# Patient Record
Sex: Female | Born: 1980 | Race: White | Hispanic: No | Marital: Married | Smoking: Current every day smoker
Health system: Southern US, Community
[De-identification: ages and names within clinical notes are randomized; demographics above are authoritative.]

## PROBLEM LIST (undated history)

## (undated) DIAGNOSIS — F419 Anxiety disorder, unspecified: Secondary | ICD-10-CM

---

## 2005-05-07 ENCOUNTER — Emergency Department: Payer: Self-pay | Admitting: Emergency Medicine

## 2005-06-03 ENCOUNTER — Emergency Department: Payer: Self-pay | Admitting: Emergency Medicine

## 2005-07-08 ENCOUNTER — Emergency Department: Payer: Self-pay | Admitting: Emergency Medicine

## 2006-01-04 ENCOUNTER — Emergency Department: Payer: Self-pay | Admitting: Internal Medicine

## 2010-04-23 ENCOUNTER — Emergency Department: Payer: Self-pay | Admitting: Emergency Medicine

## 2010-11-05 ENCOUNTER — Inpatient Hospital Stay (INDEPENDENT_AMBULATORY_CARE_PROVIDER_SITE_OTHER)
Admission: RE | Admit: 2010-11-05 | Discharge: 2010-11-05 | Disposition: A | Discharge status: Home / Self Care | Payer: Self-pay | Source: Ambulatory Visit | Attending: Family Medicine | Admitting: Family Medicine

## 2010-11-05 DIAGNOSIS — N3090 Cystitis, unspecified without hematuria: Secondary | ICD-10-CM

## 2010-11-05 LAB — POCT URINALYSIS DIP (DEVICE)
Glucose, UA: NEGATIVE mg/dL
Nitrite: NEGATIVE
Specific Gravity, Urine: 1.02 (ref 1.005–1.030)

## 2010-11-05 LAB — POCT PREGNANCY, URINE: Preg Test, Ur: NEGATIVE

## 2011-05-31 ENCOUNTER — Emergency Department: Payer: Self-pay | Admitting: Emergency Medicine

## 2011-05-31 LAB — COMPREHENSIVE METABOLIC PANEL
Anion Gap: 12 (ref 7–16)
Bilirubin,Total: 0.6 mg/dL (ref 0.2–1.0)
Calcium, Total: 8.6 mg/dL (ref 8.5–10.1)
Co2: 21 mmol/L (ref 21–32)
EGFR (African American): >60
EGFR (Non-African Amer.): >60
Glucose: 114 mg/dL — ABNORMAL HIGH (ref 65–99)
Potassium: 3.6 mmol/L (ref 3.5–5.1)
Total Protein: 7.6 g/dL (ref 6.4–8.2)

## 2011-05-31 LAB — CBC
HCT: 43.5 % (ref 35.0–47.0)
MCH: 29.3 pg (ref 26.0–34.0)
MCHC: 33.2 g/dL (ref 32.0–36.0)
MCV: 88 fL (ref 80–100)
RDW: 12.9 % (ref 11.5–14.5)

## 2011-05-31 LAB — URINALYSIS, COMPLETE
Bilirubin,UR: NEGATIVE
Blood: NEGATIVE
Glucose,UR: NEGATIVE mg/dL (ref 0–75)
Nitrite: NEGATIVE
Ph: 5 (ref 4.5–8.0)
Protein: 30
Specific Gravity: 1.033 (ref 1.003–1.030)

## 2011-05-31 LAB — PREGNANCY, URINE: Pregnancy Test, Urine: NEGATIVE m[IU]/mL

## 2011-05-31 LAB — RAPID INFLUENZA A&B ANTIGENS

## 2011-09-10 ENCOUNTER — Emergency Department: Payer: Self-pay | Admitting: Emergency Medicine

## 2014-01-21 ENCOUNTER — Emergency Department: Payer: Self-pay | Admitting: Emergency Medicine

## 2014-01-21 LAB — URINALYSIS, COMPLETE
BILIRUBIN, UR: NEGATIVE
BLOOD: NEGATIVE
Bacteria: NONE SEEN
GLUCOSE, UR: NEGATIVE mg/dL (ref 0–75)
KETONE: NEGATIVE
Leukocyte Esterase: NEGATIVE
NITRITE: NEGATIVE
PROTEIN: NEGATIVE
Ph: 8 (ref 4.5–8.0)
RBC,UR: 1 /HPF (ref 0–5)
Specific Gravity: 1.013 (ref 1.003–1.030)
WBC UR: NONE SEEN /HPF (ref 0–5)

## 2014-01-21 LAB — COMPREHENSIVE METABOLIC PANEL
ALBUMIN: 3.2 g/dL — AB (ref 3.4–5.0)
ALT: 18 U/L
AST: 19 U/L (ref 15–37)
Alkaline Phosphatase: 61 U/L
Anion Gap: 5 — ABNORMAL LOW (ref 7–16)
BUN: 9 mg/dL (ref 7–18)
Bilirubin,Total: 0.3 mg/dL (ref 0.2–1.0)
CALCIUM: 8.2 mg/dL — AB (ref 8.5–10.1)
CO2: 30 mmol/L (ref 21–32)
Chloride: 108 mmol/L — ABNORMAL HIGH (ref 98–107)
Creatinine: 0.62 mg/dL (ref 0.60–1.30)
Glucose: 88 mg/dL (ref 65–99)
OSMOLALITY: 283 (ref 275–301)
POTASSIUM: 3.9 mmol/L (ref 3.5–5.1)
Sodium: 143 mmol/L (ref 136–145)
Total Protein: 6.5 g/dL (ref 6.4–8.2)

## 2014-01-21 LAB — LIPASE, BLOOD: LIPASE: 131 U/L (ref 73–393)

## 2014-01-21 LAB — CBC WITH DIFFERENTIAL/PLATELET
Basophil #: 0.1 10*3/uL (ref 0.0–0.1)
Basophil %: 0.6 %
EOS PCT: 1.1 %
Eosinophil #: 0.1 10*3/uL (ref 0.0–0.7)
HCT: 39.7 % (ref 35.0–47.0)
HGB: 12.3 g/dL (ref 12.0–16.0)
LYMPHS PCT: 24.8 %
Lymphocyte #: 3.2 10*3/uL (ref 1.0–3.6)
MCH: 28.7 pg (ref 26.0–34.0)
MCHC: 31.1 g/dL — ABNORMAL LOW (ref 32.0–36.0)
MCV: 92 fL (ref 80–100)
MONO ABS: 0.8 x10 3/mm (ref 0.2–0.9)
Monocyte %: 6.2 %
NEUTROS PCT: 67.3 %
Neutrophil #: 8.7 10*3/uL — ABNORMAL HIGH (ref 1.4–6.5)
Platelet: 250 10*3/uL (ref 150–440)
RBC: 4.3 10*6/uL (ref 3.80–5.20)
RDW: 13.9 % (ref 11.5–14.5)
WBC: 12.9 10*3/uL — ABNORMAL HIGH (ref 3.6–11.0)

## 2014-01-21 LAB — WET PREP, GENITAL

## 2014-01-21 LAB — GC/CHLAMYDIA PROBE AMP

## 2014-01-21 LAB — PREGNANCY, URINE: PREGNANCY TEST, URINE: NEGATIVE m[IU]/mL

## 2015-10-12 ENCOUNTER — Encounter: Payer: Self-pay | Admitting: Emergency Medicine

## 2015-10-12 ENCOUNTER — Emergency Department: Payer: Medicaid Other

## 2015-10-12 ENCOUNTER — Emergency Department
Admission: EM | Admit: 2015-10-12 | Discharge: 2015-10-12 | Disposition: A | Discharge status: Home / Self Care | Payer: Medicaid Other | Attending: Emergency Medicine | Admitting: Emergency Medicine

## 2015-10-12 DIAGNOSIS — R61 Generalized hyperhidrosis: Secondary | ICD-10-CM | POA: Diagnosis not present

## 2015-10-12 DIAGNOSIS — H9190 Unspecified hearing loss, unspecified ear: Secondary | ICD-10-CM | POA: Insufficient documentation

## 2015-10-12 DIAGNOSIS — H6693 Otitis media, unspecified, bilateral: Secondary | ICD-10-CM | POA: Diagnosis not present

## 2015-10-12 DIAGNOSIS — J069 Acute upper respiratory infection, unspecified: Secondary | ICD-10-CM | POA: Diagnosis not present

## 2015-10-12 DIAGNOSIS — H9193 Unspecified hearing loss, bilateral: Secondary | ICD-10-CM

## 2015-10-12 DIAGNOSIS — F17200 Nicotine dependence, unspecified, uncomplicated: Secondary | ICD-10-CM | POA: Diagnosis not present

## 2015-10-12 DIAGNOSIS — R112 Nausea with vomiting, unspecified: Secondary | ICD-10-CM | POA: Diagnosis not present

## 2015-10-12 DIAGNOSIS — R0602 Shortness of breath: Secondary | ICD-10-CM | POA: Diagnosis present

## 2015-10-12 HISTORY — DX: Anxiety disorder, unspecified: F41.9

## 2015-10-12 LAB — URINALYSIS COMPLETE WITH MICROSCOPIC (ARMC ONLY)
BACTERIA UA: NONE SEEN
Bilirubin Urine: NEGATIVE
Glucose, UA: NEGATIVE mg/dL
HGB URINE DIPSTICK: NEGATIVE
Ketones, ur: NEGATIVE mg/dL
LEUKOCYTES UA: NEGATIVE
Nitrite: NEGATIVE
PH: 7 (ref 5.0–8.0)
Protein, ur: NEGATIVE mg/dL
Specific Gravity, Urine: 1.017 (ref 1.005–1.030)

## 2015-10-12 LAB — CBC
HCT: 36.3 % (ref 35.0–47.0)
HEMOGLOBIN: 12.2 g/dL (ref 12.0–16.0)
MCH: 29.3 pg (ref 26.0–34.0)
MCHC: 33.6 g/dL (ref 32.0–36.0)
MCV: 87.1 fL (ref 80.0–100.0)
PLATELETS: 361 10*3/uL (ref 150–440)
RBC: 4.16 MIL/uL (ref 3.80–5.20)
RDW: 13.7 % (ref 11.5–14.5)
WBC: 14.7 10*3/uL — AB (ref 3.6–11.0)

## 2015-10-12 LAB — BASIC METABOLIC PANEL
ANION GAP: 7 (ref 5–15)
BUN: 10 mg/dL (ref 6–20)
CO2: 26 mmol/L (ref 22–32)
Calcium: 8.7 mg/dL — ABNORMAL LOW (ref 8.9–10.3)
Chloride: 105 mmol/L (ref 101–111)
Creatinine, Ser: 0.63 mg/dL (ref 0.44–1.00)
Glucose, Bld: 121 mg/dL — ABNORMAL HIGH (ref 65–99)
POTASSIUM: 4 mmol/L (ref 3.5–5.1)
SODIUM: 138 mmol/L (ref 135–145)

## 2015-10-12 LAB — POCT PREGNANCY, URINE: PREG TEST UR: NEGATIVE

## 2015-10-12 MED ORDER — AMOXICILLIN-POT CLAVULANATE 200-28.5 MG PO CHEW: 1.0000 | CHEWABLE_TABLET | Freq: Two times a day (BID) | ORAL | Status: AC

## 2015-10-12 NOTE — ED Provider Notes (Signed)
[facility] Emergency Department Provider Note  ____________________________________________  Time seen: Approximately 2:44 PM  I have reviewed the triage vital signs and the nursing notes.   HISTORY  Chief Complaint Shortness of Breath and Numbness    HPI Allison Mckee Abdalla is a 35 y.o. female with a history of anxiety resenting with decreased hearing, diaphoresis, and vomiting. The patient reports that last week she had an upper respiratory infection that was deemed viral by her primary care physician. Since then she has had "muffled" hearing as well as cough and congestion. She has not had any fever, chills, shortness of breath. Today she was at work when she noticed that she could no longer here with her boss was saying, developed diaphoresis, and had an episode of vomiting.Marland Kitchen. No chest pain, palpitations, lightheadedness or syncope. This episode was similar in character to previous panic attacks, but more severe.   Past Medical History  Diagnosis Date  . Anxiety     There are no active problems to display for this patient.   No past surgical history on file.  Current Outpatient Rx  Name  Route  Sig  Dispense  Refill  . amoxicillin-clavulanate (AUGMENTIN) 200-28.5 MG chewable tablet   Oral   Chew 1 tablet by mouth 2 (two) times daily.   20 tablet   0     Allergies Aleve and Vicodin  No family history on file.  Social History Social History  Substance Use Topics  . Smoking status: Current Every Day Smoker  . Smokeless tobacco: None  . Alcohol Use: No    Review of Systems Constitutional: No fever/chills.No lightheadedness or syncope. Positive diaphoresis. Eyes: No visual changes. EARS: Decreased hearing. ENT: Positive sore throat. Positive congestion and rhinorrhea. Cardiovascular: Denies chest pain. Denies palpitations. Respiratory: Denies shortness of breath.  Positive cough. Gastrointestinal: No abdominal pain.  Positive nausea,  positive vomiting.  No diarrhea.  No constipation. Genitourinary: Negative for dysuria. Musculoskeletal: Negative for back pain. Skin: Negative for rash. Neurological: Negative for headaches. No focal numbness, tingling or weakness.  Psych: pos anxiety 10-point ROS otherwise negative.  ____________________________________________   PHYSICAL EXAM:  VITAL SIGNS: ED Triage Vitals  Enc Vitals Group     BP 10/12/15 1319 141/89 mmHg     Pulse Rate 10/12/15 1319 79     Resp 10/12/15 1319 20     Temp 10/12/15 1319 97.8 F (36.6 C)     Temp src --      SpO2 10/12/15 1319 100 %     Weight --      Height --      Head Cir --      Peak Flow --      Pain Score 10/12/15 1315 0     Pain Loc --      Pain Edu? --      Excl. in GC? --     Constitutional: Alert and oriented. Well appearing and in no acute distress. Answers questions appropriately. Eyes: Conjunctivae are normal.  EOMI. No scleral icterus.No eye discharge. Head: Atraumatic. Nose: Positive congestion/rhinnorhea. Mouth/Throat: Mucous membranes are moist. No posterior pharyngeal erythema. Posterior palate is symmetric and uvula is midline. No tonsillar swelling but the right tonsil has some mild exudate. EARS: Bilateral ears have fluid behind the tympanic membranes, with some mild erythema around the circumference. The canals are clear bilaterally. Neck: No stridor.  Supple.  No JVD. No meningismus. Cardiovascular: Normal rate, regular rhythm. No murmurs, rubs or gallops.  Respiratory: Normal  respiratory effort.  No accessory muscle use or retractions. Lungs CTAB.  No wheezes, rales or ronchi. Gastrointestinal: Soft, nontender and nondistended.  No guarding or rebound.  No peritoneal signs. Musculoskeletal: No LE edema. No ttp in the calves or palpable cords.  Negative Homan's sign. Neurologic:  A&Ox3.  Speech is clear.  Face and smile are symmetric.  EOMI. PERRLA.  Moves all extremities well. Normal gait without ataxia. Skin:   Skin is warm, dry and intact. No rash noted. Psychiatric: Mood and affect are normal. Speech and behavior are normal.  Normal judgement.  ____________________________________________   LABS (all labs ordered are listed, but only abnormal results are displayed)  Labs Reviewed  URINALYSIS COMPLETEWITH MICROSCOPIC ([facility] ONLY) - Abnormal; Notable for the following:    Color, Urine YELLOW (*)    APPearance CLEAR (*)    Squamous Epithelial / LPF 0-5 (*)    All other components within normal limits  CBC - Abnormal; Notable for the following:    WBC 14.7 (*)    All other components within normal limits  BASIC METABOLIC PANEL - Abnormal; Notable for the following:    Glucose, Bld 121 (*)    Calcium 8.7 (*)    All other components within normal limits  POC URINE PREG, ED  POCT PREGNANCY, URINE   ____________________________________________  EKG  ED ECG REPORT I, Rockne Menghini, the attending physician, personally viewed and interpreted this ECG.   Date: 10/12/2015  EKG Time: 1324  Rate: 59  Rhythm: sinus bradycardia  Axis: Normal  Intervals:none  ST&T Change: Nonspecific T-wave inversion in V1. No ST elevation.  ____________________________________________  RADIOLOGY  Dg Chest 2 View  10/12/2015  CLINICAL DATA:  35 year old female with a history of arm numbness and shortness of breath EXAM: CHEST  2 VIEW COMPARISON:  None. FINDINGS: The heart size and mediastinal contours are within normal limits. Both lungs are clear. The visualized skeletal structures are unremarkable. IMPRESSION: No active cardiopulmonary disease. Signed, Yvone Neu. Loreta Ave, DO [facility] Radiology Electronically Signed   By: Gilmer Mor D.O.   On: 10/12/2015 13:41    ____________________________________________   PROCEDURES  Procedure(s) performed: None  Critical Care performed: No ____________________________________________   INITIAL  IMPRESSION / ASSESSMENT AND PLAN / ED COURSE  Pertinent labs & imaging results that were available during my care of the patient were reviewed by me and considered in my medical decision making (see chart for details).  35 y.o. female with a history of anxiety presenting with muffled hearing and congestion with tonsillar exudate on exam and fluid behind the bilateral tympanic membranes, as well as an episode of diaphoresis and vomiting earlier today., The patient is well-appearing and has stable vital signs. It is likely that she has a viral URI, but given that she has fluid and some erythema in the tympanic membranes, I'll treat her with antibiotics for otitis media. The episode that occurred where she lost her hearing completely is atypical, and may have been a panic attack. I do not see any evidence of other acute cardiac or infectious causes for this episode. At this time the patient is back to baseline. We'll get basic labs, and reevaluate the patient. Anticipate discharge with close PMD follow-up.  ----------------------------------------- 3:37 PM on 10/12/2015 -----------------------------------------  The patient's workup in the emergency department is reassuring. Her EKG does not show any signs of ischemic changes or arrhythmia. Her chest x-ray does not show any acute cardiopulmonary process. Her labs are within  normal limits. No plan to discharge the patient home with treatment for otitis media and have her follow-up with her primary care physician for further evaluation and treatment. ____________________________________________  FINAL CLINICAL IMPRESSION(S) / ED DIAGNOSES  Final diagnoses:  Bilateral acute otitis media, recurrence not specified, unspecified otitis media type  URI (upper respiratory infection)  Decreased hearing, bilateral  Non-intractable vomiting with nausea, vomiting of unspecified type  Diaphoresis      NEW MEDICATIONS STARTED DURING THIS VISIT:  New  Prescriptions   AMOXICILLIN-CLAVULANATE (AUGMENTIN) 200-28.5 MG CHEWABLE TABLET    Chew 1 tablet by mouth 2 (two) times daily.     Rockne Menghini, MD 10/12/15 1537

## 2015-10-12 NOTE — ED Notes (Addendum)
Pt to ed with c/o sob and difficulty hearing that started about 30 min pta.  Pt states she was at work and began to get sob and weak and then had difficulty hearing.  Pt then states she started having numbness in bilat arms.  Pt reports hx of anxiety and states recently her md has been decreasing her anxiety meds.  Currently states she is on 2 tabs of 0.5mg  of ativan everyday.

## 2015-10-12 NOTE — ED Notes (Addendum)
Pt with episode of hearing loss w/ ringing, weakness, diaphoresis.  Pt reports having virus with diarrhea last week, but states she has been feeling better x1 week.   Pt also notes her medication for anxiety and depression have been recently adjusted.

## 2015-10-12 NOTE — ED Notes (Signed)
Water and graham crackers given for PO challenge.

## 2015-10-12 NOTE — ED Notes (Signed)
Pt observed going out the door in wheelchair with family.  Noted to be smoking outside of the ER.

## 2015-10-12 NOTE — Discharge Instructions (Signed)
Please make a follow up appointment with a primary care physician.  Please take the entire course of antibiotics, even if you are feeling better.  Return to the emergency department for severe pain, lightheadedness or fainting, inability to keep down fluids, or for any other symptoms concerning to you.

## 2018-02-27 IMAGING — CR DG CHEST 2V
2 series · 2 of 2 positions shown · non-contrast
Comparison: None.

CLINICAL DATA: 35-year-old female with a history of arm numbness
and shortness of breath

EXAM:
CHEST  2 VIEW

[chest pa]
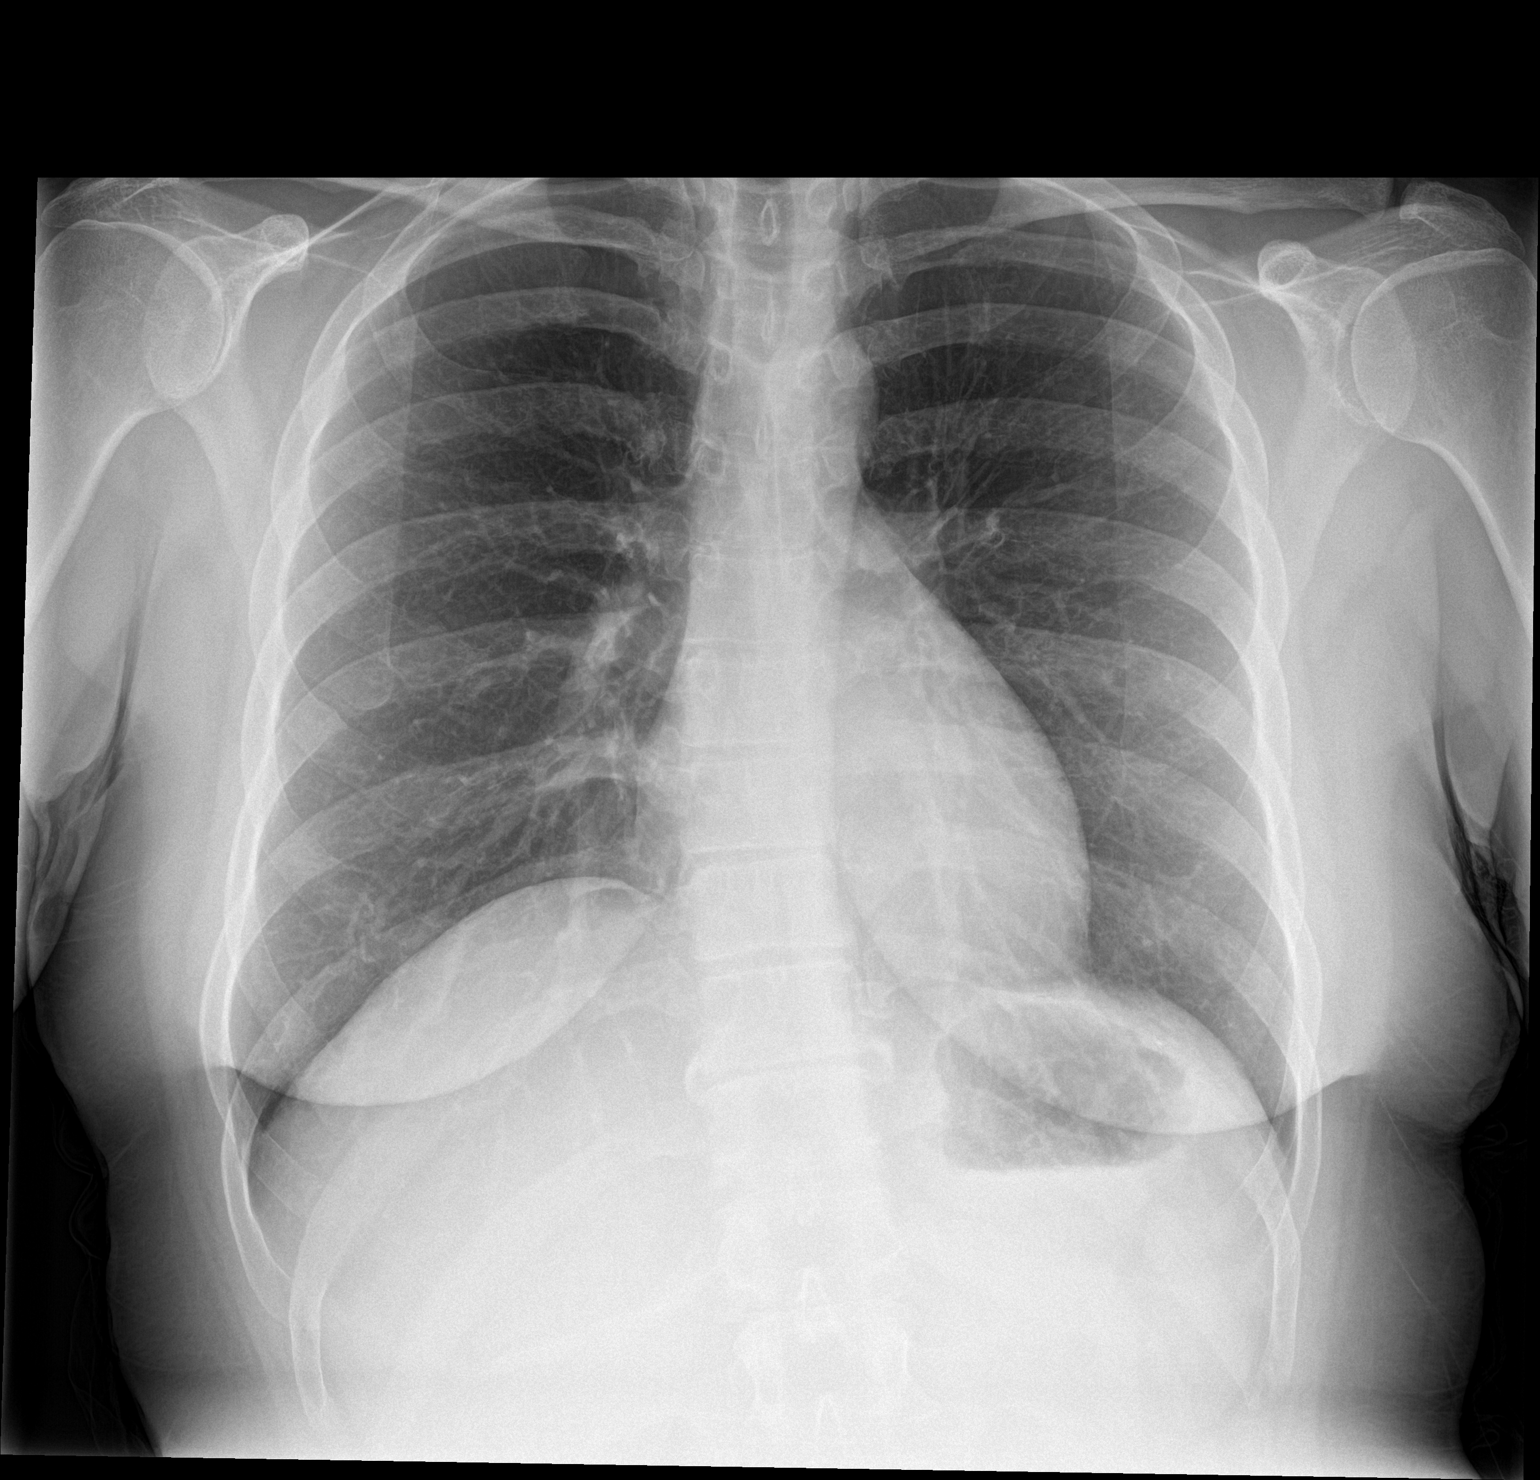

[chest lat]
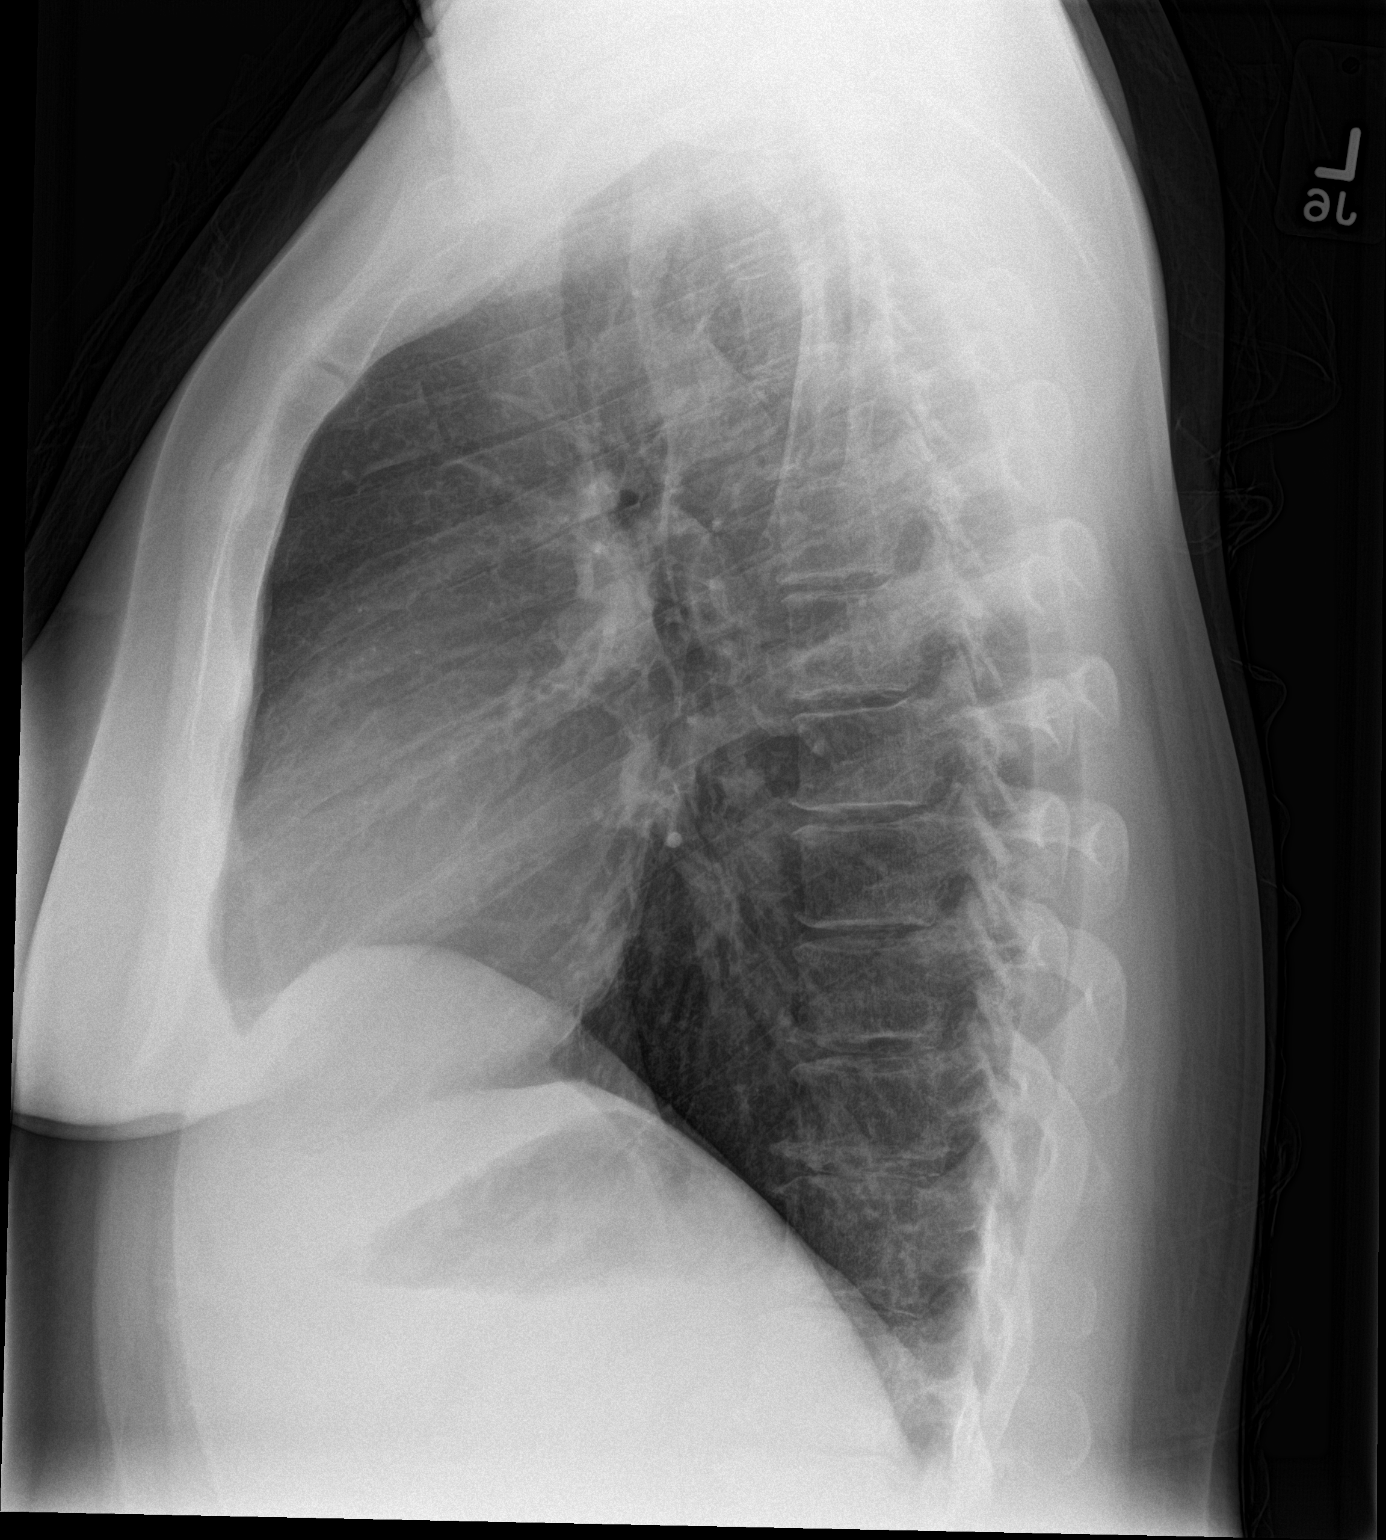

[2 of 2 positions shown; findings below may reference images not displayed]

FINDINGS: The heart size and mediastinal contours are within normal limits.
Both lungs are clear. The visualized skeletal structures are
unremarkable.
IMPRESSION: No active cardiopulmonary disease.

## 2018-03-10 ENCOUNTER — Other Ambulatory Visit: Payer: Self-pay

## 2018-03-10 ENCOUNTER — Emergency Department
Admission: EM | Admit: 2018-03-10 | Discharge: 2018-03-10 | Disposition: A | Discharge status: Home / Self Care | Payer: Self-pay | Attending: Emergency Medicine | Admitting: Emergency Medicine

## 2018-03-10 DIAGNOSIS — F410 Panic disorder [episodic paroxysmal anxiety] without agoraphobia: Secondary | ICD-10-CM | POA: Insufficient documentation

## 2018-03-10 DIAGNOSIS — F17200 Nicotine dependence, unspecified, uncomplicated: Secondary | ICD-10-CM | POA: Insufficient documentation

## 2018-03-10 NOTE — ED Triage Notes (Signed)
Pt arrived via ems from home from a panic attack. Pt ran out of her xanax last week. Pt seen having crying outbursts at random times. Pt A&Ox4, NAD.

## 2018-03-10 NOTE — ED Provider Notes (Signed)
[facility] Emergency Department Provider Note  ____________________________________________  Time seen: Approximately 9:48 PM  I have reviewed the triage vital signs and the nursing notes.   HISTORY  Chief Complaint Panic Attack    HPI Arlis CORI JUSTUS is a 37 y.o. female with a history of anxiety comes the ED after sudden onset of feeling like her whole body stiffened up today and she could not move.  She was very upset.  Onset was after being upset because her new puppy tore up the couch and she and her husband were arguing about financial concerns.   No loss of consciousness.  No chest pain shortness of breath fever vision change headache weakness or paresthesias.  On arrival to the ED she is feeling better.  She complains that her toes still feel curled up and stiff.  Denies any focal pain.  She notes that she has been on Ativan 2 mg tablets for many years.  She recently had seen a psychiatrist a few months ago and "begged them" to increase her dose but they did not.  They started her on a mood stabilizer for bipolar disorder.  She admits that over the last month she is been taking extra Ativan here in there because she has been feeling more stressed, and she ran out 5 days ago.  She does not see her doctor for another 3 days.  Denies SI HI or hallucinations.  No paranoia or delusions.     Past Medical History:  Diagnosis Date  . Anxiety      There are no active problems to display for this patient.    History reviewed. No pertinent surgical history.   Prior to Admission medications   Not on File     Allergies Aleve [naproxen sodium] and Vicodin [hydrocodone-acetaminophen]   History reviewed. No pertinent family history.  Social History Social History   Tobacco Use  . Smoking status: Current Every Day Smoker  Substance Use Topics  . Alcohol use: No  . Drug use: No    Review of Systems  Constitutional:   No fever or chills.  ENT:    No sore throat. No rhinorrhea. Cardiovascular:   No chest pain or syncope. Respiratory:   No dyspnea or cough. Gastrointestinal:   Negative for abdominal pain, vomiting and diarrhea.  Musculoskeletal:   Negative for focal pain or swelling All other systems reviewed and are negative except as documented above in ROS and HPI.  ____________________________________________   PHYSICAL EXAM:  VITAL SIGNS: ED Triage Vitals  Enc Vitals Group     BP 03/10/18 1830 (!) 150/76     Pulse Rate 03/10/18 1826 85     Resp 03/10/18 1826 (!) 22     Temp 03/10/18 1830 98.2 F (36.8 C)     Temp Source 03/10/18 1830 Oral     SpO2 03/10/18 1830 100 %     Weight 03/10/18 1827 170 lb (77.1 kg)     Height 03/10/18 1827 5\' 1"  (1.549 m)     Head Circumference --      Peak Flow --      Pain Score 03/10/18 1827 0     Pain Loc --      Pain Edu? --      Excl. in GC? --     Vital signs reviewed, nursing assessments reviewed.   Constitutional:   Alert and oriented. Non-toxic appearance. Eyes:   Conjunctivae are normal. EOMI.  ENT      Head:  Normocephalic and atraumatic.         Neck:   No meningismus. Full ROM.  Cardiovascular:   RRR. Symmetric bilateral DP pulses.  Cap refill less than 2 seconds. Respiratory:   Normal respiratory effort without tachypnea Musculoskeletal:   Normal range of motion in all extremities. No joint effusions.  No lower extremity tenderness.  No edema.  Toes are curled up, but can be straightened without tetany. Neurologic:   Normal speech and language.  Motor grossly intact. No acute focal neurologic deficits are appreciated.  Skin:    Skin is warm, dry and intact. No rash noted.  No petechiae, purpura, or bullae.  ____________________________________________    LABS (pertinent positives/negatives) (all labs ordered are listed, but only abnormal results are displayed) Labs Reviewed - No data to  display ____________________________________________   EKG    ____________________________________________    RADIOLOGY  No results found.  ____________________________________________   PROCEDURES Procedures  ____________________________________________    CLINICAL IMPRESSION / ASSESSMENT AND PLAN / ED COURSE  Pertinent labs & imaging results that were available during my care of the patient were reviewed by me and considered in my medical decision making (see chart for details).      Clinical Course as of Mar 11 2147  Wynelle LinkSun Mar 10, 2018  2008 Patient presented with panic attack characterized by feeling out of control and having her whole body tighten up on her, feeling immobilized.  No loss of consciousness.  Not consistent with stroke or seizure.  No significant trauma.  She is now feeling better and calm.  She attributes this to running out of her Ativan 5 days ago due to taking a little bit extra every now and then.  Review of the controlled substance reporting system shows that she actually has a refill available to her.  I will ask her to call her pharmacy to double check.  She also has an appointment with her doctor in 3 days already.   [PS]  2009 She has a supportive family at bedside.  No evidence of any danger to herself or others, no psychosis.  Do not see any reason to suspect an acute cardiac, pulmonary, neurologic, or vascular event.  She stable for discharge home.  Vital signs are normal.   [PS]    Clinical Course User Index [PS] Sharman CheekStafford, Iolani Twilley, MD     ____________________________________________   FINAL CLINICAL IMPRESSION(S) / ED DIAGNOSES    Final diagnoses:  Panic attack     ED Discharge Orders    None      Portions of this note were generated with dragon dictation software. Dictation errors may occur despite best attempts at proofreading.    Sharman CheekStafford, Camilla Skeen, MD 03/10/18 2152

## 2018-03-10 NOTE — Discharge Instructions (Addendum)
Continue following up with your doctor regarding your symptoms.  Check with your pharmacy about a refill of your Ativan.  According to our records, you have 1 refill available already.

## 2018-03-10 NOTE — ED Notes (Signed)
Pt expresses want to be discharged. EDP made aware.

## 2018-09-08 ENCOUNTER — Emergency Department
Admission: EM | Admit: 2018-09-08 | Discharge: 2018-09-08 | Disposition: A | Discharge status: Home / Self Care | Payer: Medicaid Other | Attending: Emergency Medicine | Admitting: Emergency Medicine

## 2018-09-08 ENCOUNTER — Other Ambulatory Visit: Payer: Self-pay

## 2018-09-08 ENCOUNTER — Encounter: Payer: Self-pay | Admitting: Emergency Medicine

## 2018-09-08 DIAGNOSIS — F17210 Nicotine dependence, cigarettes, uncomplicated: Secondary | ICD-10-CM | POA: Diagnosis not present

## 2018-09-08 DIAGNOSIS — R238 Other skin changes: Secondary | ICD-10-CM | POA: Diagnosis not present

## 2018-09-08 DIAGNOSIS — R21 Rash and other nonspecific skin eruption: Secondary | ICD-10-CM | POA: Diagnosis present

## 2018-09-08 MED ORDER — ACYCLOVIR 200 MG PO CAPS: 200.0000 mg | ORAL_CAPSULE | Freq: Every day | ORAL | 0 refills | Status: AC

## 2018-09-08 MED ORDER — CEPHALEXIN 500 MG PO CAPS: 1000.0000 mg | ORAL_CAPSULE | Freq: Two times a day (BID) | ORAL | 0 refills | Status: AC

## 2018-09-08 NOTE — ED Provider Notes (Signed)
[facility] Emergency Department Provider Note  ____________________________________________  Time seen: Approximately 10:51 PM  I have reviewed the triage vital signs and the nursing notes.   HISTORY  Chief Complaint Rash     HPI Allison Mckee is a 38 y.o. female who presents to the emergency department complaining of 7-10 days of increasing vesicular rash.   Patient reports that she first noted lesions to the right lateral thigh.  Since then she has noticed several other areas including the opposite extremity, 1 to the upper arm.  Patient reports that they are pruritic in nature until she touches them in which case they have a sharp pins and needle sensation.  Patient denies any enlargement of the vesicles once they formed.  She denies any fevers or chills, intraoral lesions, cough, shortness of breath, abdominal pain, nausea or vomiting.  No dysuria, polyuria, hematuria.  She denies any vaginal bleeding or discharge.  No external genital lesions.  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  She reports that this occurred after the formation of the first vesicle.  Patient also reports that she is on lamotrigine, originally had been tapered off and that has been starting same.  Patient reports that she had been on lamotrigine for a while with no difficulties.  Rash is not apparently associated with stopping or starting this medication.        Past Medical History:  Diagnosis Date  . Anxiety     There are no active problems to display for this patient.   History reviewed. No pertinent surgical history.  Prior to Admission medications   Medication Sig Start Date End Date Taking? Authorizing Provider  acyclovir (ZOVIRAX) 200 MG capsule Take 1 capsule (200 mg total) by mouth 5 (five) times daily for 7 days. 09/08/18 09/15/18  Julien Berryman, Delorise RoyalsJonathan D, PA-C  cephALEXin (KEFLEX) 500 MG capsule Take 2 capsules (1,000 mg total) by mouth 2  (two) times daily. 09/08/18   Shebra Muldrow, Delorise RoyalsJonathan D, PA-C    Allergies Aleve [naproxen sodium] and Vicodin [hydrocodone-acetaminophen]  No family history on file.  Social History Social History   Tobacco Use  . Smoking status: Current Every Day Smoker  . Smokeless tobacco: Never Used  Substance Use Topics  . Alcohol use: No  . Drug use: No     Review of Systems  Constitutional: No fever/chills Eyes: No visual changes. No discharge ENT: No upper respiratory complaints. Cardiovascular: no chest pain. Respiratory: no cough. No SOB. Gastrointestinal: No abdominal pain.  No nausea, no vomiting.  No diarrhea.  No constipation. Musculoskeletal: Negative for musculoskeletal pain. Skin: Positive for scattered vesicular rash to bilateral legs, R arm Neurological: Negative for headaches, focal weakness or numbness. 10-point ROS otherwise negative.  ____________________________________________   PHYSICAL EXAM:  VITAL SIGNS: ED Triage Vitals  Enc Vitals Group     BP 09/08/18 2141 120/88     Pulse Rate 09/08/18 2141 80     Resp 09/08/18 2141 18     Temp 09/08/18 2141 98.3 F (36.8 C)     Temp Source 09/08/18 2141 Oral     SpO2 09/08/18 2141 98 %     Weight 09/08/18 2140 170 lb (77.1 kg)     Height 09/08/18 2140 5\' 1"  (1.549 m)     Head Circumference --      Peak Flow --      Pain Score 09/08/18 2140 0     Pain Loc --  Pain Edu? --      Excl. in [place]? --      Constitutional: Alert and oriented. Well appearing and in no acute distress. Eyes: Conjunctivae are normal. PERRL. EOMI. Head: Atraumatic. ENT:      Ears:       Nose: No congestion/rhinnorhea.      Mouth/Throat: Mucous membranes are moist. No intraoral lesions Neck: No stridor.  Hematological/Lymphatic/Immunilogical: No cervical lymphadenopathy. Cardiovascular: Normal rate, regular rhythm. Normal S1 and S2.  Good peripheral circulation. Respiratory: Normal respiratory effort without tachypnea or retractions.  Lungs CTAB. Good air entry to the bases with no decreased or absent breath sounds. Musculoskeletal: Full range of motion to all extremities. No gross deformities appreciated. Neurologic:  Normal speech and language. No gross focal neurologic deficits are appreciated.  Skin:  Skin is warm, dry and intact.  Visualization of skin reveals multiple areas of small, clustered vesicles.  The sites are primarily right lateral thigh, left lower extremity below the knee, right upper arm.  There is no sloughing or loosening of the skin.  Patient has no surrounding erythema or edema.  Areas are minimally tender to palpation. Psychiatric: Mood and affect are normal. Speech and behavior are normal. Patient exhibits appropriate insight and judgement.   ____________________________________________   LABS (all labs ordered are listed, but only abnormal results are displayed)  Labs Reviewed - No data to display ____________________________________________  EKG   ____________________________________________  RADIOLOGY   No results found.  ____________________________________________    PROCEDURES  Procedure(s) performed:    Procedures    Medications - No data to display   ____________________________________________   INITIAL IMPRESSION / ASSESSMENT AND PLAN / ED COURSE  Pertinent labs & imaging results that were available during my care of the patient were reviewed by me and considered in my medical decision making (see chart for details).  Review of the [place] CSRS was performed in accordance of the [place] prior to dispensing any controlled drugs.           Patient's diagnosis is consistent with vesicular rash.  Patient presented to the emergency department for slowly spreading vesicular rash over the past 7 to 10 days.  Patient had one lesion to the lateral thigh on the right side and now has experienced lesions to the right upper arm as well as the left lower extremity.  Differential  included bullous pemphigoid or bullous impetigo, herpes zoster, herpetic eczema, Stevens-Johnson syndrome.  Patient is on lamotrigine.  However as this rash has slowly spread over 7 to 10 days with no worsening of vesicles with they have formed, I have a low suspicion for Stevens-Johnson's.  There is no intraoral lesions or airway involvement.  No sloughing of skin.  Patient is on lamotrigine and so I have advised her to follow-up with her psychiatrist in the morning to discuss this rash, again I have a very low suspicion at this time of Stevens-Johnson's.  Rash has the appearance of herpetic eczema versus bullous pemphigoid.  Patient does have a history of cold sores.  No history of genital herpes.  Patient has under gone stress, apparently appears to be going through some hormone changes as well.  I suspect that this is either bullous pemphigoid/impetigo versus herpetic eczema.  As such patient will be started on both antiviral as well as antibiotics.  I have discussed at length my differential with the patient.  If patient has any sudden changes or worsening she is to return immediately to the emergency department as  differential did include Stevens-Johnson's.  Otherwise follow with primary care and psychiatry..Patient is given ED precautions to return to the ED for any worsening or new symptoms.     ____________________________________________  FINAL CLINICAL IMPRESSION(S) / ED DIAGNOSES  Final diagnoses:  Vesicular rash      NEW MEDICATIONS STARTED DURING THIS VISIT:  ED Discharge Orders         Ordered    acyclovir (ZOVIRAX) 200 MG capsule  5 times daily     09/08/18 2322    cephALEXin (KEFLEX) 500 MG capsule  2 times daily     09/08/18 2322              This chart was dictated using voice recognition software/Dragon. Despite best efforts to proofread, errors can occur which can change the meaning. Any change was purely unintentional.    Racheal PatchesCuthriell, Melita Villalona D, PA-C 09/09/18  0039    Arnaldo NatalMalinda, Paul F, MD 09/09/18 [id number]

## 2018-09-08 NOTE — ED Triage Notes (Signed)
Pt arrives POV to triage with c/o rash on body. Rash in in several spots on pt's body. Pt is in NAD.

## 2019-01-17 ENCOUNTER — Ambulatory Visit
Admission: RE | Admit: 2019-01-17 | Discharge: 2019-01-17 | Disposition: A | Discharge status: Home / Self Care | Payer: Disability Insurance | Attending: Thoracic Diseases | Admitting: Thoracic Diseases

## 2019-01-17 ENCOUNTER — Ambulatory Visit
Admission: RE | Admit: 2019-01-17 | Discharge: 2019-01-17 | Disposition: A | Discharge status: Home / Self Care | Payer: Disability Insurance | Source: Ambulatory Visit | Attending: Thoracic Diseases | Admitting: Thoracic Diseases

## 2019-01-17 ENCOUNTER — Other Ambulatory Visit: Payer: Self-pay | Admitting: Thoracic Diseases

## 2019-01-17 DIAGNOSIS — M549 Dorsalgia, unspecified: Secondary | ICD-10-CM | POA: Insufficient documentation

## 2021-06-04 IMAGING — CR DG LUMBAR SPINE 2-3V
3 series · 3 of 3 positions shown · non-contrast
Comparison: October 16, 2011

CLINICAL DATA: Chronic lumbago

EXAM:
LUMBAR SPINE - 2-3 VIEW

[l-spine ap]
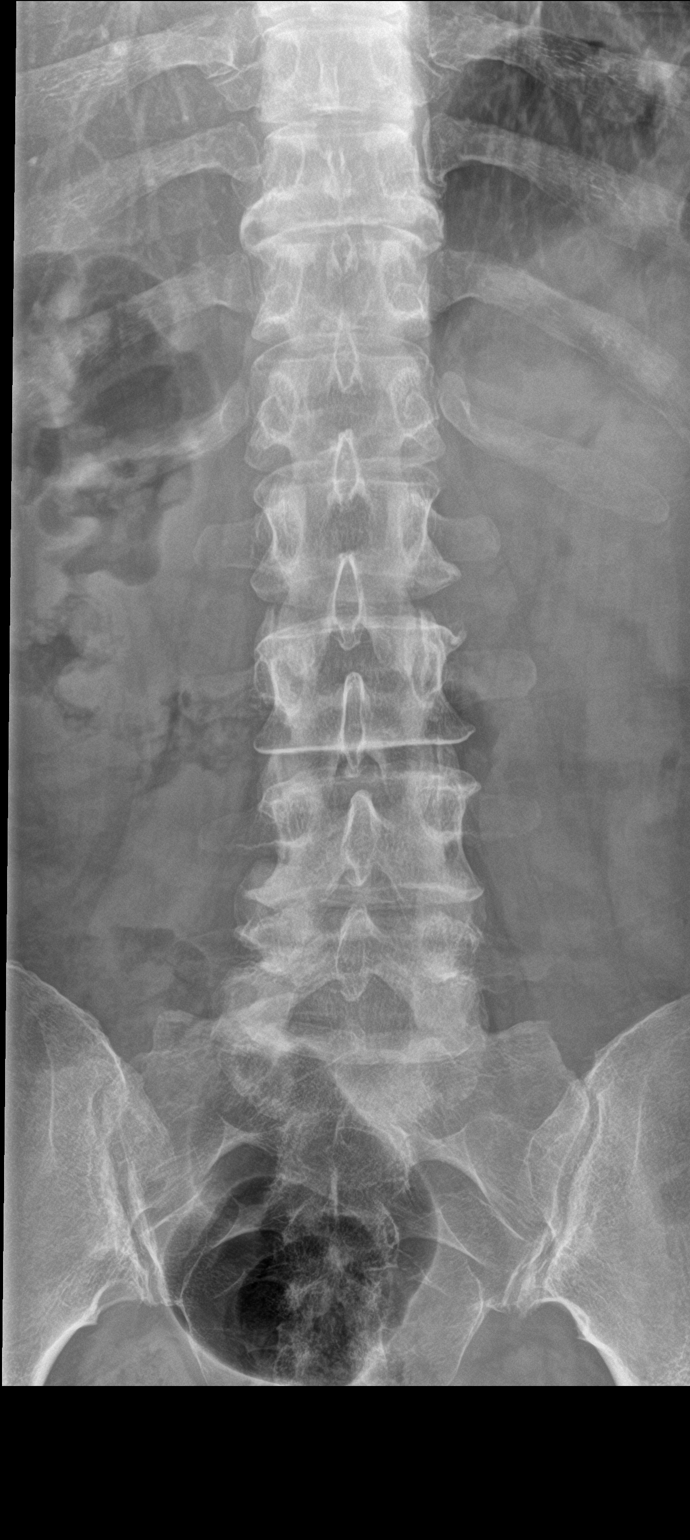

[l-spine lat]
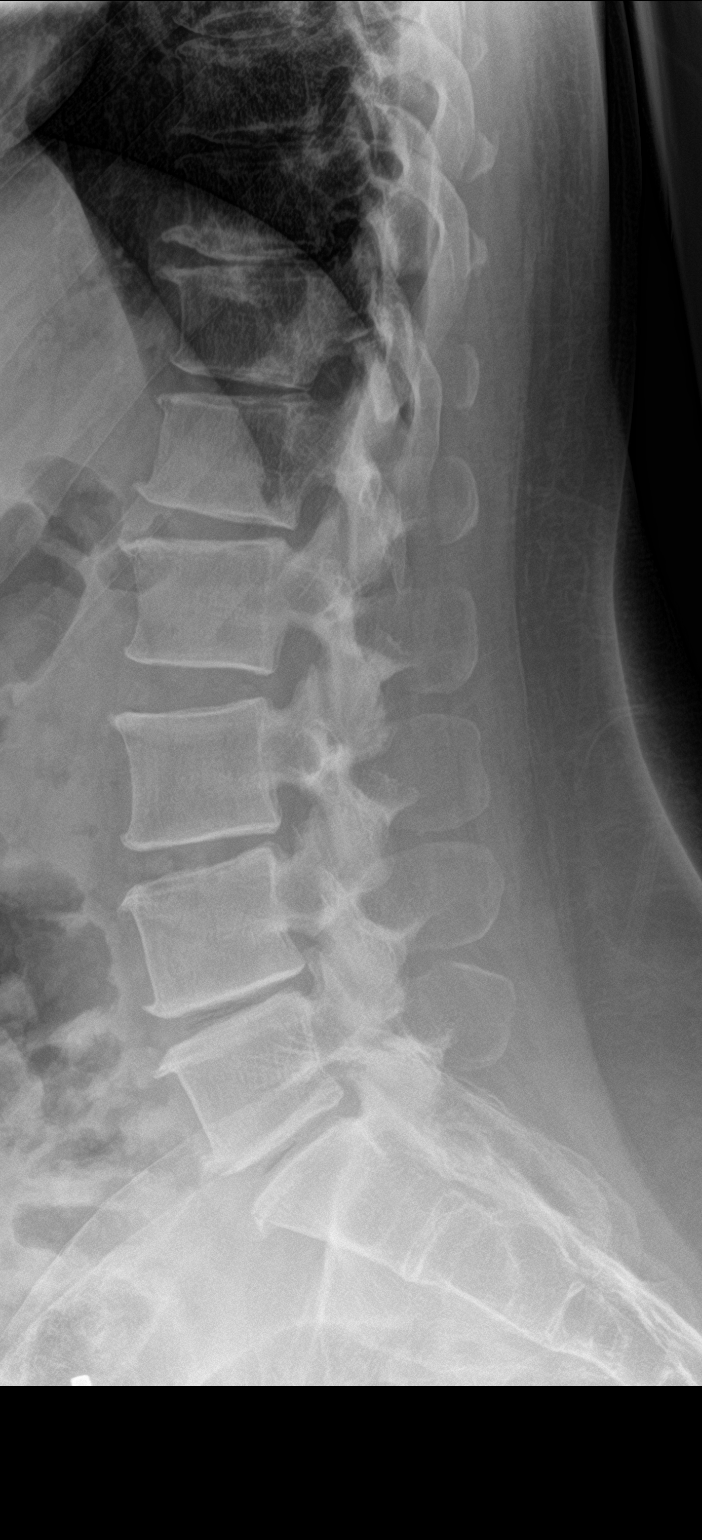

[l-spine spot]
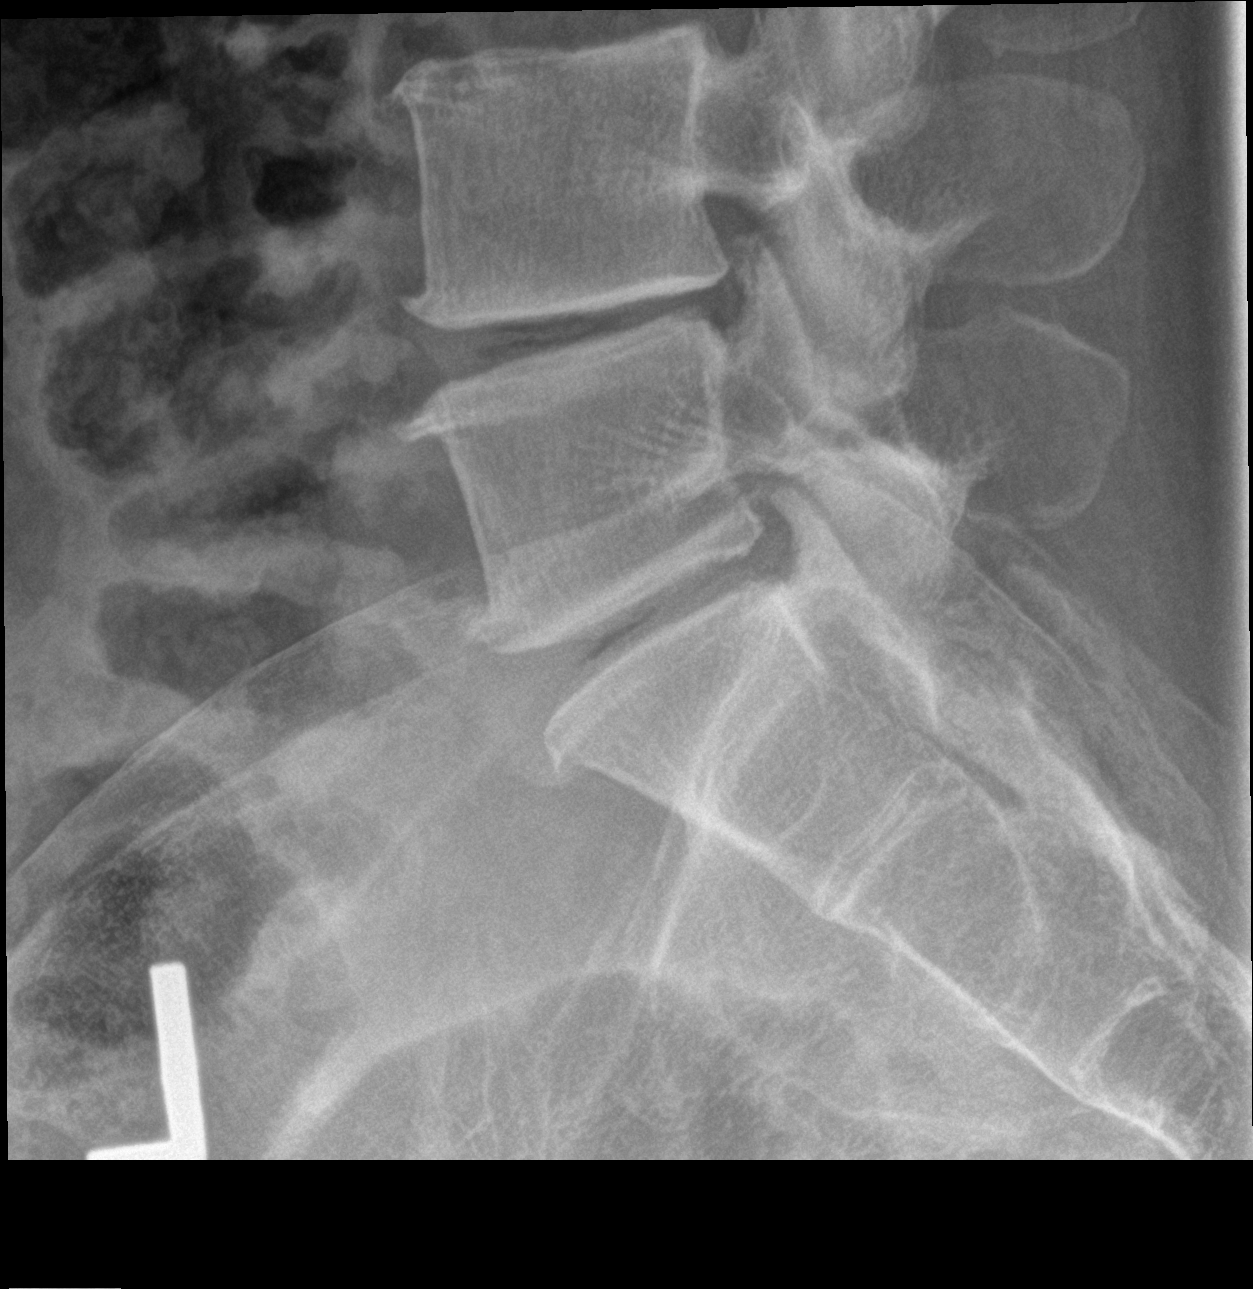

[3 of 3 positions shown; findings below may reference images not displayed]

FINDINGS: Frontal, lateral, and spot lumbosacral lateral images were obtained.
There are 4 non-rib-bearing lumbar type vertebral bodies. There is
no fracture or spondylolisthesis. There is moderate disc space
narrowing at L2-3 with moderately severe disc space narrowing at
L3-4 and L4-S1. There is vacuum phenomenon at L3-4 and L4-S1. No
erosive changes. There are anterior osteophytes at L3, L4, and S1.
IMPRESSION: Osteoarthritic change at L2-3, L3-3 4, and L4-S1 with progression of
disc space narrowing at these levels, most notably at L3-4 and L4-S1
compared to the prior study. No fracture or spondylolisthesis.
